# Patient Record
Sex: Female | Born: 1980 | Race: Black or African American | Hispanic: No | Marital: Single | Smoking: Never smoker
Health system: Southern US, Community
[De-identification: ages and names within clinical notes are randomized; demographics above are authoritative.]

---

## 2010-06-24 ENCOUNTER — Emergency Department (HOSPITAL_COMMUNITY)
Admission: EM | Admit: 2010-06-24 | Discharge: 2010-06-25 | Disposition: A | Discharge status: Home / Self Care | Payer: PRIVATE HEALTH INSURANCE | Attending: Emergency Medicine | Admitting: Emergency Medicine

## 2010-06-24 DIAGNOSIS — R110 Nausea: Secondary | ICD-10-CM | POA: Insufficient documentation

## 2010-06-24 DIAGNOSIS — R5381 Other malaise: Secondary | ICD-10-CM | POA: Insufficient documentation

## 2010-06-24 DIAGNOSIS — N390 Urinary tract infection, site not specified: Secondary | ICD-10-CM | POA: Insufficient documentation

## 2010-06-24 LAB — CBC
HCT: 40.3 % (ref 36.0–46.0)
Hemoglobin: 13.2 g/dL (ref 12.0–15.0)
MCHC: 32.8 g/dL (ref 30.0–36.0)
MCV: 86.7 fL (ref 78.0–100.0)

## 2010-06-24 LAB — DIFFERENTIAL
Basophils Absolute: 0 10*3/uL (ref 0.0–0.1)
Eosinophils Relative: 1 % (ref 0–5)
Lymphocytes Relative: 42 % (ref 12–46)
Lymphs Abs: 1.8 10*3/uL (ref 0.7–4.0)
Monocytes Absolute: 0.3 10*3/uL (ref 0.1–1.0)
Neutro Abs: 2.2 10*3/uL (ref 1.7–7.7)

## 2010-06-24 LAB — COMPREHENSIVE METABOLIC PANEL
ALT: 17 U/L (ref 0–35)
Alkaline Phosphatase: 68 U/L (ref 39–117)
BUN: 6 mg/dL (ref 6–23)
CO2: 26 mEq/L (ref 19–32)
Calcium: 9.3 mg/dL (ref 8.4–10.5)
GFR calc non Af Amer: >60 mL/min (ref >60)
Glucose, Bld: 91 mg/dL (ref 70–99)
Potassium: 3.5 mEq/L (ref 3.5–5.1)
Sodium: 137 mEq/L (ref 135–145)

## 2010-06-24 LAB — URINALYSIS, ROUTINE W REFLEX MICROSCOPIC
Glucose, UA: NEGATIVE mg/dL
Ketones, ur: NEGATIVE mg/dL
Nitrite: NEGATIVE
Protein, ur: NEGATIVE mg/dL
Urobilinogen, UA: 0.2 mg/dL (ref 0.0–1.0)

## 2010-06-24 LAB — URINE MICROSCOPIC-ADD ON

## 2010-06-24 LAB — PREGNANCY, URINE: Preg Test, Ur: NEGATIVE

## 2010-07-04 ENCOUNTER — Emergency Department (HOSPITAL_COMMUNITY)
Admission: EM | Admit: 2010-07-04 | Discharge: 2010-07-04 | Disposition: A | Discharge status: Home / Self Care | Payer: PRIVATE HEALTH INSURANCE | Attending: Emergency Medicine | Admitting: Emergency Medicine

## 2010-07-04 ENCOUNTER — Emergency Department (HOSPITAL_COMMUNITY): Payer: PRIVATE HEALTH INSURANCE

## 2010-07-04 DIAGNOSIS — R0602 Shortness of breath: Secondary | ICD-10-CM | POA: Insufficient documentation

## 2010-07-04 DIAGNOSIS — R070 Pain in throat: Secondary | ICD-10-CM | POA: Insufficient documentation

## 2010-07-04 DIAGNOSIS — R5381 Other malaise: Secondary | ICD-10-CM | POA: Insufficient documentation

## 2010-07-04 DIAGNOSIS — K5900 Constipation, unspecified: Secondary | ICD-10-CM | POA: Insufficient documentation

## 2010-07-04 DIAGNOSIS — R079 Chest pain, unspecified: Secondary | ICD-10-CM | POA: Insufficient documentation

## 2010-07-04 DIAGNOSIS — K219 Gastro-esophageal reflux disease without esophagitis: Secondary | ICD-10-CM | POA: Insufficient documentation

## 2010-07-04 DIAGNOSIS — R112 Nausea with vomiting, unspecified: Secondary | ICD-10-CM | POA: Insufficient documentation

## 2010-07-04 DIAGNOSIS — R109 Unspecified abdominal pain: Secondary | ICD-10-CM | POA: Insufficient documentation

## 2010-07-04 DIAGNOSIS — N12 Tubulo-interstitial nephritis, not specified as acute or chronic: Secondary | ICD-10-CM | POA: Insufficient documentation

## 2010-07-04 LAB — COMPREHENSIVE METABOLIC PANEL WITH GFR
AST: 24 U/L (ref 0–37)
Albumin: 3.9 g/dL (ref 3.5–5.2)
Alkaline Phosphatase: 70 U/L (ref 39–117)
BUN: 3 mg/dL — ABNORMAL LOW (ref 6–23)
Chloride: 105 meq/L (ref 96–112)
Creatinine, Ser: 0.85 mg/dL (ref 0.4–1.2)
GFR calc Af Amer: >60 mL/min (ref >60)
Potassium: 4.1 meq/L (ref 3.5–5.1)
Total Bilirubin: 0.7 mg/dL (ref 0.3–1.2)
Total Protein: 7 g/dL (ref 6.0–8.3)

## 2010-07-04 LAB — DIFFERENTIAL
Basophils Absolute: 0 K/uL (ref 0.0–0.1)
Basophils Relative: 0 % (ref 0–1)
Eosinophils Absolute: 0 K/uL (ref 0.0–0.7)
Eosinophils Relative: 0 % (ref 0–5)
Lymphocytes Relative: 32 % (ref 12–46)
Lymphs Abs: 2.2 10*3/uL (ref 0.7–4.0)
Monocytes Absolute: 0.5 10*3/uL (ref 0.1–1.0)
Monocytes Relative: 7 % (ref 3–12)
Neutro Abs: 4.1 10*3/uL (ref 1.7–7.7)
Neutrophils Relative %: 60 % (ref 43–77)

## 2010-07-04 LAB — COMPREHENSIVE METABOLIC PANEL
ALT: 15 U/L (ref 0–35)
CO2: 25 mEq/L (ref 19–32)
Calcium: 9.1 mg/dL (ref 8.4–10.5)
GFR calc non Af Amer: >60 mL/min (ref >60)
Glucose, Bld: 106 mg/dL — ABNORMAL HIGH (ref 70–99)
Sodium: 135 mEq/L (ref 135–145)

## 2010-07-04 LAB — CBC
HCT: 36.6 % (ref 36.0–46.0)
Hemoglobin: 12.4 g/dL (ref 12.0–15.0)
MCH: 29.2 pg (ref 26.0–34.0)
MCHC: 33.9 g/dL (ref 30.0–36.0)
MCV: 86.1 fL (ref 78.0–100.0)
Platelets: 348 K/uL (ref 150–400)
RBC: 4.25 MIL/uL (ref 3.87–5.11)
RDW: 13.4 % (ref 11.5–15.5)
WBC: 6.8 K/uL (ref 4.0–10.5)

## 2010-07-04 LAB — URINALYSIS, ROUTINE W REFLEX MICROSCOPIC
Bilirubin Urine: NEGATIVE
Ketones, ur: 15 mg/dL — AB
Nitrite: NEGATIVE
Urobilinogen, UA: 0.2 mg/dL (ref 0.0–1.0)

## 2010-07-04 LAB — POCT PREGNANCY, URINE: Preg Test, Ur: NEGATIVE

## 2010-07-04 LAB — LIPASE, BLOOD: Lipase: 18 U/L (ref 11–59)

## 2010-07-05 LAB — URINE CULTURE
Colony Count: 35000
Culture  Setup Time: 201204211144

## 2016-02-11 ENCOUNTER — Ambulatory Visit (INDEPENDENT_AMBULATORY_CARE_PROVIDER_SITE_OTHER): Payer: BC Managed Care – PPO | Admitting: Physician Assistant

## 2016-02-11 VITALS — BP 112/76 | HR 67 | Temp 98.9°F | Resp 16 | Ht 69.0 in | Wt 262.0 lb

## 2016-02-11 DIAGNOSIS — Z136 Encounter for screening for cardiovascular disorders: Secondary | ICD-10-CM | POA: Diagnosis not present

## 2016-02-11 DIAGNOSIS — Z1389 Encounter for screening for other disorder: Secondary | ICD-10-CM

## 2016-02-11 DIAGNOSIS — Z130 Encounter for screening for diseases of the blood and blood-forming organs and certain disorders involving the immune mechanism: Secondary | ICD-10-CM

## 2016-02-11 DIAGNOSIS — Z13228 Encounter for screening for other metabolic disorders: Secondary | ICD-10-CM | POA: Diagnosis not present

## 2016-02-11 DIAGNOSIS — Z0000 Encounter for general adult medical examination without abnormal findings: Secondary | ICD-10-CM

## 2016-02-11 DIAGNOSIS — Z113 Encounter for screening for infections with a predominantly sexual mode of transmission: Secondary | ICD-10-CM

## 2016-02-11 DIAGNOSIS — Z1329 Encounter for screening for other suspected endocrine disorder: Secondary | ICD-10-CM | POA: Diagnosis not present

## 2016-02-11 DIAGNOSIS — E875 Hyperkalemia: Secondary | ICD-10-CM

## 2016-02-11 LAB — POC MICROSCOPIC URINALYSIS (UMFC): MUCUS RE: ABSENT

## 2016-02-11 LAB — POCT URINALYSIS DIP (MANUAL ENTRY)
BILIRUBIN UA: NEGATIVE
BILIRUBIN UA: NEGATIVE
GLUCOSE UA: NEGATIVE
Leukocytes, UA: NEGATIVE
Nitrite, UA: NEGATIVE
Protein Ur, POC: NEGATIVE
SPEC GRAV UA: 1.025
UROBILINOGEN UA: 0.2
pH, UA: 5.5

## 2016-02-11 MED ORDER — NITROFURANTOIN MONOHYD MACRO 100 MG PO CAPS: 100.0000 mg | ORAL_CAPSULE | Freq: Two times a day (BID) | ORAL | 0 refills | Status: AC

## 2016-02-11 NOTE — Patient Instructions (Addendum)
Please hydrate well with water with water.  Take the antibiotic as prescribed.    Keeping You Healthy  Get These Tests 1. Blood Pressure- Have your blood pressure checked once a year by your health care provider.  Normal blood pressure is 120/80. 2. Weight- Have your body mass index (BMI) calculated to screen for obesity.  BMI is measure of body fat based on height and weight.  You can also calculate your own BMI at https://www.west-esparza.com/www.nhlbisupport.com/bmi/. 3. Cholesterol- Have your cholesterol checked every 5 years starting at age 34 then yearly starting at age 34. 4. Chlamydia, HIV, and other sexually transmitted diseases- Get screened every year until age 34, then within three months of each new sexual provider. 5. Pap Test - Every 1-5 years; discuss with your health care provider. 6. Mammogram- Every 1-2 years starting at age 34--50  Take these medicines  Calcium with Vitamin D-Your body needs 1200 mg of Calcium each day and [phone] IU of Vitamin D daily.  Your body can only absorb 500 mg of Calcium at a time so Calcium must be taken in 2 or 3 divided doses throughout the day.  Multivitamin with folic acid- Once daily if it is possible for you to become pregnant.  Get these Immunizations  Gardasil-Series of three doses; prevents HPV related illness such as genital warts and cervical cancer.  Menactra-Single dose; prevents meningitis.  Tetanus shot- Every 10 years.  Flu shot-Every year.  Take these steps 1. Do not smoke-Your healthcare provider can help you quit.  For tips on how to quit go to www.smokefree.gov or call 1-800 QUITNOW. 2. Be physically active- Exercise 5 days a week for at least 30 minutes.  If you are not already physically active, start slow and gradually work up to 30 minutes of moderate physical activity.  Examples of moderate activity include walking briskly, dancing, swimming, bicycling, etc. 3. Breast Cancer- A self breast exam every month is important for early detection  of breast cancer.  For more information and instruction on self breast exams, ask your healthcare provider or [place].eswww.womenshealth.gov/faq/breast-self-exam.cfm. 4. Eat a healthy diet- Eat a variety of healthy foods such as fruits, vegetables, whole grains, low fat milk, low fat cheeses, yogurt, lean meats, poultry and fish, beans, nuts, tofu, etc.  For more information go to www. Thenutritionsource.org 5. Drink alcohol in moderation- Limit alcohol intake to one drink or less per day. Never drink and drive. 6. Depression- Your emotional health is as important as your physical health.  If you're feeling down or losing interest in things you normally enjoy please talk to your healthcare provider about being screened for depression. 7. Dental visit- Brush and floss your teeth twice daily; visit your dentist twice a year. 8. Eye doctor- Get an eye exam at least every 2 years. 9. Helmet use- Always wear a helmet when riding a bicycle, motorcycle, rollerblading or skateboarding. 10. Safe sex- If you may be exposed to sexually transmitted infections, use a condom. 11. Seat belts- Seat belts can save your live; always wear one. 12. Smoke/Carbon Monoxide detectors- These detectors need to be installed on the appropriate level of your home. Replace batteries at least once a year. 13. Skin cancer- When out in the sun please cover up and use sunscreen 15 SPF or higher. 14. Violence- If anyone is threatening or hurting you, please tell your healthcare provider.     IF you received an x-ray today, you will receive an invoice from [facility]. Please contact [facility]  at [phone] with questions or concerns regarding your invoice.   IF you received labwork today, you will receive an invoice from [facility]. Please contact [facility] at [phone] with questions or concerns regarding your invoice.   Our billing staff will not be able to assist you with questions  regarding bills from these companies.  You will be contacted with the lab results as soon as they are available. The fastest way to get your results is to activate your My Chart account. Instructions are located on the last page of this paperwork. If you have not heard from us regarding the results in 2 weeks, please contact this office.

## 2016-02-11 NOTE — Progress Notes (Signed)
[facility] [address], [place] [id number] [phone]  Date:  02/11/2016   Name:  [name]   DOB:  07/19/1980   MRN:  [id number]  PCP:  No primary care provider on file.    History of Present Illness:  Becky Hess is a 35 y.o. female patient who [facility].   Diet: Diet is "bad".  Moved from [place] so have not eaten well.  Eating a lot of fastfoods and salads.    BM: Normal.  1-2 per day.  No blood or black stool   Urine: darker, due to the lack of hydration.  No dysuria.    Sleep: not sleeping well.  5-6 hours for 2-3 years.    Social Activity: going to the gym.  TV.  Writing.   Sexually active--not active sexual intercourse pain.   EtOH: 1-3 times per month with 1 glass Illicit drug use: none Tobacco or vaping: none   Reports right sternal pain that last for seconds.  It is not associated with exertion, sob, diaphoresis,or dizziness.  She denies hx of reflux.    Attempting to conceive at this time. This has been for about 6-8 months.  [place] sees obgyn in [place] and will follow up with gynecologist in the area, now that she has moved.   There are no active problems to display for this patient.   No past medical history on file.  No past surgical history on file.  Social History  Substance Use Topics  . Smoking status: Never Smoker  . Smokeless tobacco: Never Used  . Alcohol use Not on file    No family history on file.  No Known Allergies  Medication list has been reviewed and updated.  No current outpatient prescriptions on file prior to visit.   No current facility-administered medications on file prior to visit.     ROS ROS otherwise unremarkable unless listed above.   Physical Examination: BP 112/76 (BP Location: Right Arm, Patient Position: Sitting, Cuff Size: Large)   Pulse 67   Temp 98.9 F (37.2 C)   Resp 16   Ht 5\' 9"  (1.753 m)   Wt 262 lb (118.8 kg)   LMP 01/13/2016 (Approximate)    SpO2 100%   BMI 38.69 kg/m  Ideal Body Weight: Weight in (lb) to have BMI = 25: 168.9  Physical Exam  Constitutional: She is oriented to person, place, and time. She appears well-developed and well-nourished. No distress.  HENT:  Head: Normocephalic and atraumatic.  Right Ear: External ear normal.  Left Ear: External ear normal.  Eyes: Conjunctivae and EOM are normal. Pupils are equal, round, and reactive to light.  Cardiovascular: Normal rate and regular rhythm.  Exam reveals no friction rub.   No murmur heard. Pulses:      Radial pulses are 2+ on the right side, and 2+ on the left side.       Dorsalis pedis pulses are 2+ on the right side, and 2+ on the left side.  Pulmonary/Chest: Effort normal. No respiratory distress.  Neurological: She is alert and oriented to person, place, and time.  Skin: She is not diaphoretic.  Psychiatric: She has a normal mood and affect. Her behavior is normal.     Assessment and Plan: Becky Hess is a 35 y.o. female who is here today for annual physical exam. Concerning V1, V2.  At this time cardiology consult is appreciated.  I would like this to clear her  so that she may continue lifestyle modifications such as exercise, which she is eager to do.   Pap, performed by her gynecologist.  Annual physical exam - Plan: CBC, TSH, COMPLETE METABOLIC PANEL WITH GFR, Lipid panel, RPR, HIV antibody, EKG 12-Lead, POCT urinalysis dipstick, POCT Microscopic Urinalysis ([facility]), Ambulatory referral to Cardiology  Screening for deficiency anemia - Plan: CBC  Screening for thyroid disorder - Plan: TSH  Screening for metabolic disorder - Plan: COMPLETE METABOLIC PANEL WITH GFR, POCT Microscopic Urinalysis ([facility])  Screening for STD (sexually transmitted disease) - Plan: RPR, HIV antibody  Screening for hematuria or proteinuria - Plan: POCT urinalysis dipstick, POCT Microscopic Urinalysis ([facility])  Screening for cardiovascular condition - Plan: EKG 12-Lead,  Ambulatory referral to Cardiology  Hyperkalemia - Plan: Basic metabolic panel  Trena PlattStephanie Ieesha Abbasi, PA-C [facility] 12/4/20179:33 AM

## 2016-02-12 LAB — COMPLETE METABOLIC PANEL WITH GFR
ALK PHOS: 62 U/L (ref 33–115)
ALT: 12 U/L (ref 6–29)
AST: 16 U/L (ref 10–30)
Albumin: 4 g/dL (ref 3.6–5.1)
BILIRUBIN TOTAL: 0.5 mg/dL (ref 0.2–1.2)
BUN: 12 mg/dL (ref 7–25)
CALCIUM: 9.1 mg/dL (ref 8.6–10.2)
CHLORIDE: 103 mmol/L (ref 98–110)
CO2: 25 mmol/L (ref 20–31)
CREATININE: 0.72 mg/dL (ref 0.50–1.10)
GFR, Est African American: >89 mL/min (ref >=60)
GFR, Est Non African American: >89 mL/min (ref >=60)
GLUCOSE: 79 mg/dL (ref 65–99)
POTASSIUM: 6.1 mmol/L — AB (ref 3.5–5.3)
SODIUM: 135 mmol/L (ref 135–146)
Total Protein: 7 g/dL (ref 6.1–8.1)

## 2016-02-12 LAB — CBC
HCT: 40 % (ref 35.0–45.0)
Hemoglobin: 12.6 g/dL (ref 11.7–15.5)
MCH: 28.4 pg (ref 27.0–33.0)
MCHC: 31.5 g/dL — ABNORMAL LOW (ref 32.0–36.0)
MCV: 90.3 fL (ref 80.0–100.0)
MPV: 12.6 fL — AB (ref 7.5–12.5)
PLATELETS: 232 10*3/uL (ref 140–400)
RBC: 4.43 MIL/uL (ref 3.80–5.10)
RDW: 14.4 % (ref 11.0–15.0)
WBC: 5.6 10*3/uL (ref 3.8–10.8)

## 2016-02-12 LAB — LIPID PANEL
Cholesterol: 164 mg/dL (ref <200)
HDL: 43 mg/dL — AB (ref >50)
LDL CALC: 98 mg/dL (ref <100)
TRIGLYCERIDES: 117 mg/dL (ref <150)
Total CHOL/HDL Ratio: 3.8 Ratio (ref <5.0)
VLDL: 23 mg/dL (ref <30)

## 2016-02-12 LAB — TSH: TSH: 2.09 mIU/L

## 2016-02-12 LAB — HIV ANTIBODY (ROUTINE TESTING W REFLEX): HIV 1&2 Ab, 4th Generation: NONREACTIVE

## 2016-02-13 LAB — RPR

## 2016-02-14 ENCOUNTER — Other Ambulatory Visit (INDEPENDENT_AMBULATORY_CARE_PROVIDER_SITE_OTHER): Payer: BC Managed Care – PPO | Admitting: Physician Assistant

## 2016-02-14 DIAGNOSIS — E875 Hyperkalemia: Secondary | ICD-10-CM

## 2016-02-15 LAB — BASIC METABOLIC PANEL
BUN: 12 mg/dL (ref 7–25)
CHLORIDE: 106 mmol/L (ref 98–110)
CO2: 22 mmol/L (ref 20–31)
Calcium: 8.7 mg/dL (ref 8.6–10.2)
Creat: 0.77 mg/dL (ref 0.50–1.10)
GLUCOSE: 91 mg/dL (ref 65–99)
POTASSIUM: 3.8 mmol/L (ref 3.5–5.3)
SODIUM: 139 mmol/L (ref 135–146)

## 2016-02-27 ENCOUNTER — Encounter: Payer: Self-pay | Admitting: *Deleted

## 2016-07-05 ENCOUNTER — Other Ambulatory Visit (HOSPITAL_COMMUNITY): Payer: Self-pay | Admitting: Obstetrics and Gynecology

## 2016-07-05 DIAGNOSIS — Z3141 Encounter for fertility testing: Secondary | ICD-10-CM

## 2016-07-12 ENCOUNTER — Ambulatory Visit (HOSPITAL_COMMUNITY)
Admission: RE | Admit: 2016-07-12 | Discharge: 2016-07-12 | Disposition: A | Discharge status: Home / Self Care | Payer: BC Managed Care – PPO | Source: Ambulatory Visit | Attending: Obstetrics and Gynecology | Admitting: Obstetrics and Gynecology

## 2016-07-12 DIAGNOSIS — Z3141 Encounter for fertility testing: Secondary | ICD-10-CM

## 2016-07-12 MED ORDER — IOPAMIDOL (ISOVUE-300) INJECTION 61%
30.0000 mL | Freq: Once | INTRAVENOUS | Status: AC | PRN
  Administered 2016-07-12: 30 mL

## 2016-12-07 ENCOUNTER — Encounter: Payer: Self-pay | Admitting: Physician Assistant

## 2016-12-07 ENCOUNTER — Ambulatory Visit (INDEPENDENT_AMBULATORY_CARE_PROVIDER_SITE_OTHER): Payer: BC Managed Care – PPO | Admitting: Physician Assistant

## 2016-12-07 VITALS — BP 118/84 | HR 60 | Resp 16 | Ht 68.0 in | Wt 242.2 lb

## 2016-12-07 DIAGNOSIS — Z13220 Encounter for screening for lipoid disorders: Secondary | ICD-10-CM | POA: Diagnosis not present

## 2016-12-07 DIAGNOSIS — Z13228 Encounter for screening for other metabolic disorders: Secondary | ICD-10-CM

## 2016-12-07 DIAGNOSIS — Z1329 Encounter for screening for other suspected endocrine disorder: Secondary | ICD-10-CM | POA: Diagnosis not present

## 2016-12-07 DIAGNOSIS — Z130 Encounter for screening for diseases of the blood and blood-forming organs and certain disorders involving the immune mechanism: Secondary | ICD-10-CM | POA: Diagnosis not present

## 2016-12-07 DIAGNOSIS — Z0000 Encounter for general adult medical examination without abnormal findings: Secondary | ICD-10-CM

## 2016-12-07 NOTE — Progress Notes (Signed)
PRIMARY CARE AT [facility] [address] [place] [address] [phone]  Date:  12/07/2016   Name:  Becky Hess   DOB:  01-Sep-1980   MRN:  [id number]  PCP:  Tyson Dense, MD    History of Present Illness:  Becky Hess is a 36 y.o. female patient who presents to PCP with  Chief Complaint  Patient presents with  . Annual Exam    patient has some concerns regarding menses and pre-menses symptoms     DIET: She was eating more protein, and meal prepping.  Eating more oatmeal with raisin walnuts.  Protein shakes.  Eating vegetables and salads, and eating meat and vegetables.  1 month ago she dropped this.  She is working out.  She is portioning her foods.    BM: normal.  Black or blood stool.    URINATION: no dysuria, hematuria, or frequency.    SLEEP: she is getting about 5 hours of sleep daily.  Her max is 7 hours.    She is trying to have a child at this time.  She is following up with her gynecologist, Royston Sinner, with this at [facility].     There are no active problems to display for this patient.   No past medical history on file.  No past surgical history on file.  Social History  Substance Use Topics  . Smoking status: Never Smoker  . Smokeless tobacco: Never Used  . Alcohol use Not on file    No family history on file.  No Known Allergies  Medication list has been reviewed and updated.  Current Outpatient Prescriptions on File Prior to Visit  Medication Sig Dispense Refill  . nitrofurantoin, macrocrystal-monohydrate, (MACROBID) 100 MG capsule Take 1 capsule (100 mg total) by mouth 2 (two) times daily. (Patient not taking: Reported on 12/07/2016) 20 capsule 0   No current facility-administered medications on file prior to visit.     Review of Systems  Constitutional: Negative for chills and fever.  HENT: Negative for ear discharge, ear pain and sore throat.   Eyes: Negative for blurred vision and double vision.  Respiratory:  Negative for cough, shortness of breath and wheezing.   Cardiovascular: Negative for chest pain, palpitations and leg swelling.  Gastrointestinal: Negative for diarrhea, nausea and vomiting.  Genitourinary: Negative for dysuria, frequency and hematuria.  Skin: Negative for itching and rash.  Neurological: Negative for dizziness and headaches.   ROS otherwise unremarkable unless listed above.  Physical Examination: BP 120/80 (BP Location: Left Arm, Patient Position: Sitting, Cuff Size: Normal)   Pulse 60   Resp 16   Ht 5' 8"  (1.727 m)   Wt 242 lb 3.2 oz (109.9 kg)   LMP 11/13/2016   SpO2 98%   BMI 36.83 kg/m  Ideal Body Weight: Weight in (lb) to have BMI = 25: 164.1 Wt Readings from Last 3 Encounters:  12/07/16 242 lb 3.2 oz (109.9 kg)  02/11/16 262 lb (118.8 kg)    Physical Exam  Constitutional: She is oriented to person, place, and time. She appears well-developed and well-nourished. No distress.  HENT:  Head: Normocephalic and atraumatic.  Right Ear: External ear normal.  Left Ear: External ear normal.  Eyes: Pupils are equal, round, and reactive to light. Conjunctivae and EOM are normal.  Cardiovascular: Normal rate.   Pulmonary/Chest: Effort normal. No respiratory distress.  Neurological: She is alert and oriented to person, place, and time.  Skin: She is not diaphoretic.  Psychiatric: She  has a normal mood and affect. Her behavior is normal.     Assessment and Plan: Becky Hess is a 36 y.o. female who is here today for annual physical exam. Annual physical exam - Plan: CBC, CMP14+EGFR, Lipid panel, TSH  Screening for deficiency anemia - Plan: CBC  Screening for lipid disorders - Plan: Lipid panel  Screening for metabolic disorder - Plan: CMP14+EGFR  Screening for thyroid disorder - Plan: TSH  Ivar Drape, PA-C [facility] 9/25/20181:14 PM

## 2016-12-07 NOTE — Patient Instructions (Signed)

## 2016-12-08 LAB — CBC
HEMOGLOBIN: 13.2 g/dL (ref 11.1–15.9)
Hematocrit: 40.5 % (ref 34.0–46.6)
MCH: 28.9 pg (ref 26.6–33.0)
MCHC: 32.6 g/dL (ref 31.5–35.7)
MCV: 89 fL (ref 79–97)
Platelets: 250 10*3/uL (ref 150–379)
RBC: 4.56 x10E6/uL (ref 3.77–5.28)
RDW: 15.1 % (ref 12.3–15.4)
WBC: 6.3 10*3/uL (ref 3.4–10.8)

## 2016-12-08 LAB — CMP14+EGFR
A/G RATIO: 1.5 (ref 1.2–2.2)
ALBUMIN: 4.4 g/dL (ref 3.5–5.5)
ALK PHOS: 65 IU/L (ref 39–117)
ALT: 14 IU/L (ref 0–32)
AST: 16 IU/L (ref 0–40)
BILIRUBIN TOTAL: 0.4 mg/dL (ref 0.0–1.2)
BUN / CREAT RATIO: 11 (ref 9–23)
BUN: 10 mg/dL (ref 6–20)
CO2: 20 mmol/L (ref 20–29)
CREATININE: 0.94 mg/dL (ref 0.57–1.00)
Calcium: 9.3 mg/dL (ref 8.7–10.2)
Chloride: 100 mmol/L (ref 96–106)
GFR calc Af Amer: 91 mL/min/{1.73_m2} (ref >59)
GFR calc non Af Amer: 79 mL/min/{1.73_m2} (ref >59)
Globulin, Total: 2.9 g/dL (ref 1.5–4.5)
Glucose: 85 mg/dL (ref 65–99)
POTASSIUM: 4.6 mmol/L (ref 3.5–5.2)
SODIUM: 137 mmol/L (ref 134–144)
Total Protein: 7.3 g/dL (ref 6.0–8.5)

## 2016-12-08 LAB — LIPID PANEL
CHOLESTEROL TOTAL: 149 mg/dL (ref 100–199)
Chol/HDL Ratio: 3 ratio (ref 0.0–4.4)
HDL: 50 mg/dL (ref >39)
LDL CALC: 89 mg/dL (ref 0–99)
TRIGLYCERIDES: 52 mg/dL (ref 0–149)
VLDL Cholesterol Cal: 10 mg/dL (ref 5–40)

## 2016-12-08 LAB — TSH: TSH: 2.04 u[IU]/mL (ref 0.450–4.500)

## 2016-12-29 ENCOUNTER — Encounter: Payer: Self-pay | Admitting: Radiology

## 2017-04-21 ENCOUNTER — Telehealth: Payer: Self-pay | Admitting: Physician Assistant

## 2017-04-21 NOTE — Telephone Encounter (Signed)
Copied from CRM [id number]. Topic: Quick Communication - See Telephone Encounter >> Apr 21, 2017 11:45 AM Elliot GaultBell, Tiffany M wrote: CRM for notification. See Telephone encounter for:   04/21/17.   Relation to pt: self   Call back number: [phone]   Reason for call:  Patient applied to the sheriff department and  prior to her taking the "physical ability test" which is scheduled for tomorrow 04/22/17. physical form has to be completed. Did inform patient 5 to 7 business day turn around time but patient states she needs form back today for her to participate in ability test tomorrow. Patient faxed form to [phone], please note when received and if PCP can fill out form today, please advise  >> Apr 21, 2017 11:59 AM Elliot GaultBell, Tiffany M wrote: CRM for notification. See Telephone encounter for:   04/21/17.   Relation to pt: self   Call back number: [phone]   Reason for call:  Patient applied to the sheriff department and  prior to her taking the "physical ability test" which is scheduled for tomorrow 04/22/17. physical form has to be completed. Did inform patient 5 to 7 business day turn around time but patient states she needs form back today for her to participate in ability test tomorrow. Patient faxed form to [phone], please note when received and if PCP can fill out form today, please advise  >> Apr 21, 2017  1:15 PM Rudi CocoLathan, Emrie Gayle M, NT wrote: Pt. Calling back to see if paperwork has been received.

## 2017-04-21 NOTE — Telephone Encounter (Signed)
I spoke with pt and let her know we haven't received the form and she verbalized understanding. She said she will bring them to us. I advised pt we will be here until 6p

## 2017-04-28 ENCOUNTER — Telehealth: Payer: Self-pay | Admitting: Physician Assistant

## 2017-04-28 NOTE — Telephone Encounter (Signed)
I called and left a detailed message on pt's vm letting her know it has been faxed and she can pick up the original or we can mail it she wants us to and to call back and let us know

## 2017-04-28 NOTE — Telephone Encounter (Signed)
This has been placed in my box, and I am seeing this now.  We had looked for it 04/21/2017, and it was not present.  Please contact patient to let her know it is here, if she still needs it. Photocopy and scan.  Placing in nurse box

## 2017-06-14 ENCOUNTER — Encounter: Payer: Self-pay | Admitting: Physician Assistant

## 2017-12-15 IMAGING — RF DG HYSTEROGRAM
3 series · 3 of 3 positions shown · non-contrast
Comparison: None.

FLUOROSCOPY TIME:  Fluoroscopy Time:  48 seconds

Number of Acquired Images:  3

CLINICAL DATA: Infertility testing

EXAM:
HYSTEROSALPINGOGRAM
TECHNIQUE: Hysterosalpingogram was performed by the ordering physician under
fluoroscopy. Fluoroscopic images were submitted for radiologic
interpretation following the procedure. Please see the procedural
report for the amount of contrast and the fluoroscopy time utilized.

[Series 1: run · 1 of 1 slices shown (1 of 3)]
[im 1/1]
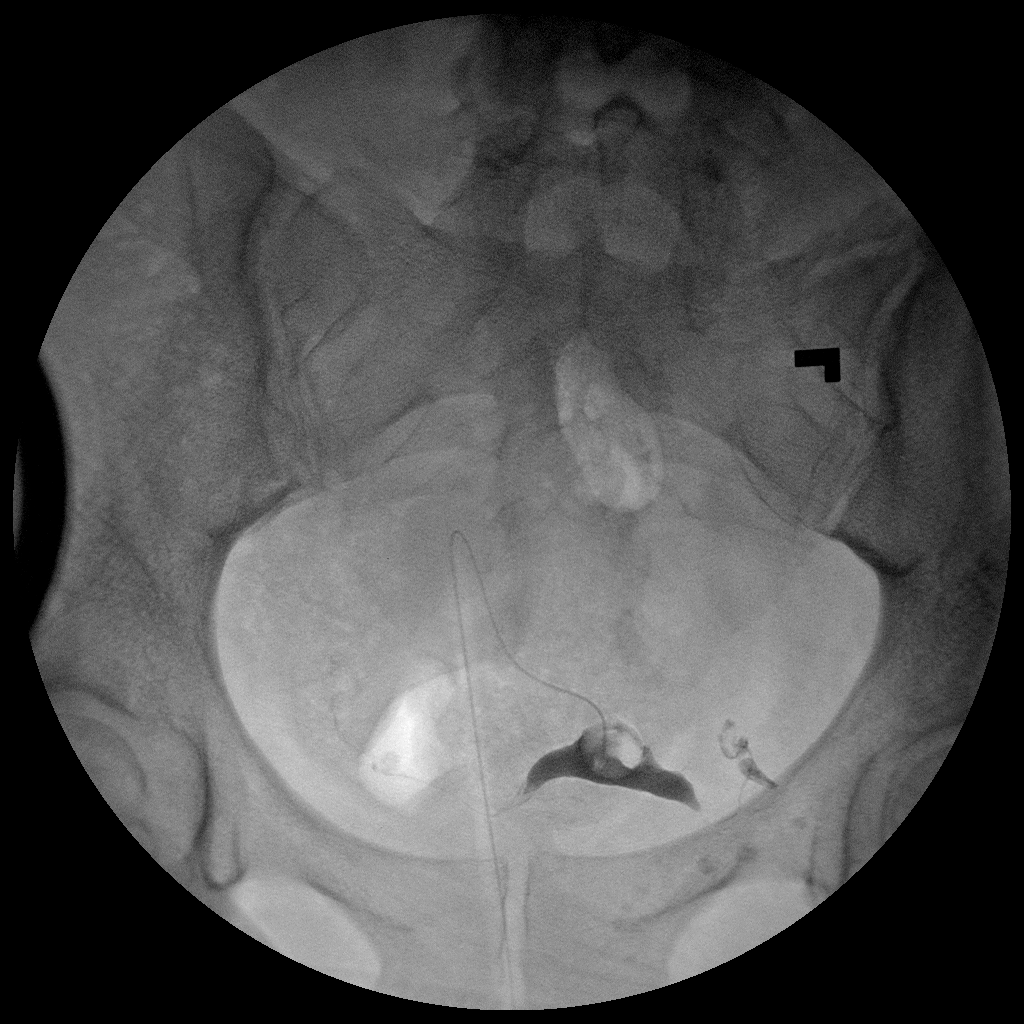

[Series 2: run · 1 of 1 slices shown (2 of 3)]
[im 1/1]
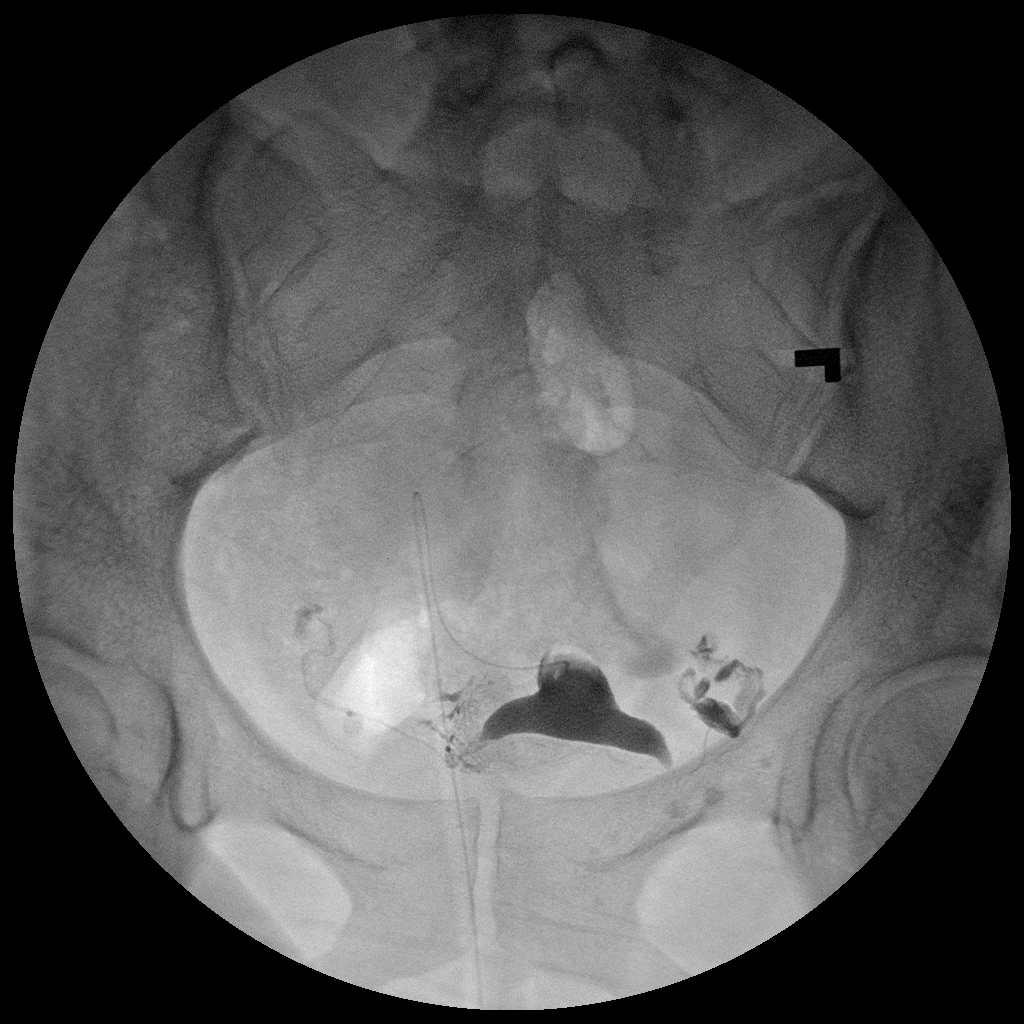

[Series 3: run · 1 of 1 slices shown (3 of 3)]
[im 1/1]
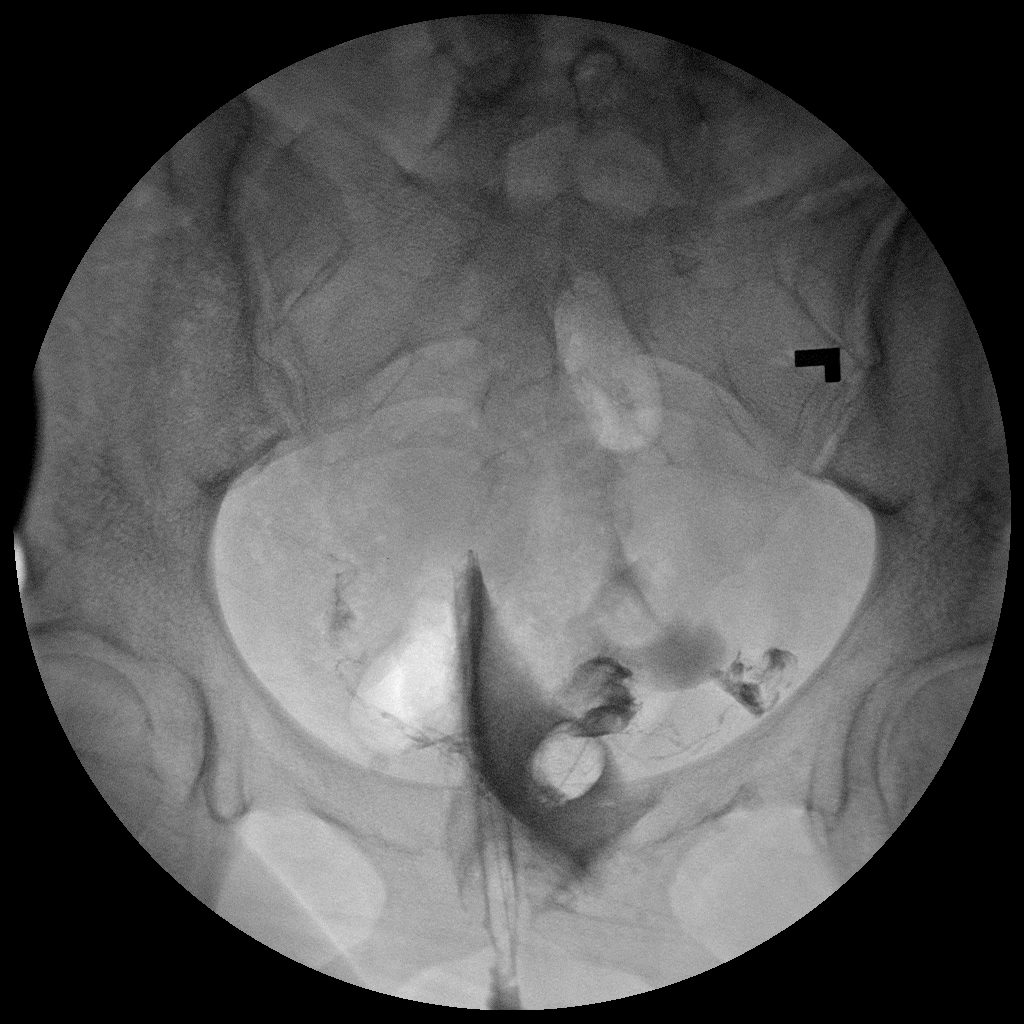

[3 of 3 positions shown; findings below may reference images not displayed]

FINDINGS: The endometrial cavity is normal in appearance and contour. No signs
of mullerian duct anomaly.

Opacification of both fallopian tubes is seen. Both tubes appear
normal. Venous intravasation noted along the right side of uterus.
Intraperitoneal spill of contrast from both fallopian tubes is
demonstrated.
IMPRESSION: Normal study. Both fallopian tubes are patent.
